# Patient Record
Sex: Male | Born: 1970 | Race: White | Hispanic: No | Marital: Married | Smoking: Former smoker
Health system: Southern US, Community
[De-identification: ages and names within clinical notes are randomized; demographics above are authoritative.]

## PROBLEM LIST (undated history)

## (undated) DIAGNOSIS — D72819 Decreased white blood cell count, unspecified: Secondary | ICD-10-CM

## (undated) DIAGNOSIS — I10 Essential (primary) hypertension: Secondary | ICD-10-CM

## (undated) DIAGNOSIS — J019 Acute sinusitis, unspecified: Secondary | ICD-10-CM

## (undated) DIAGNOSIS — M1990 Unspecified osteoarthritis, unspecified site: Secondary | ICD-10-CM

## (undated) DIAGNOSIS — Z8739 Personal history of other diseases of the musculoskeletal system and connective tissue: Secondary | ICD-10-CM

## (undated) DIAGNOSIS — E559 Vitamin D deficiency, unspecified: Secondary | ICD-10-CM

## (undated) DIAGNOSIS — I499 Cardiac arrhythmia, unspecified: Secondary | ICD-10-CM

## (undated) DIAGNOSIS — R809 Proteinuria, unspecified: Secondary | ICD-10-CM

## (undated) DIAGNOSIS — F39 Unspecified mood [affective] disorder: Secondary | ICD-10-CM

## (undated) DIAGNOSIS — E782 Mixed hyperlipidemia: Secondary | ICD-10-CM

## (undated) DIAGNOSIS — E870 Hyperosmolality and hypernatremia: Secondary | ICD-10-CM

## (undated) HISTORY — DX: Cardiac arrhythmia, unspecified: I49.9

## (undated) HISTORY — DX: Unspecified mood (affective) disorder: F39

## (undated) HISTORY — DX: Decreased white blood cell count, unspecified: D72.819

## (undated) HISTORY — DX: Hyperosmolality and hypernatremia: E87.0

## (undated) HISTORY — DX: Proteinuria, unspecified: R80.9

## (undated) HISTORY — DX: Acute sinusitis, unspecified: J01.90

## (undated) HISTORY — DX: Mixed hyperlipidemia: E78.2

## (undated) HISTORY — DX: Vitamin D deficiency, unspecified: E55.9

---

## 1999-09-23 ENCOUNTER — Encounter: Payer: Self-pay | Admitting: Family Medicine

## 1999-09-23 ENCOUNTER — Encounter: Admission: RE | Admit: 1999-09-23 | Discharge: 1999-09-23 | Payer: Self-pay | Admitting: Family Medicine

## 1999-10-10 ENCOUNTER — Encounter: Payer: Self-pay | Admitting: Surgery

## 1999-10-10 ENCOUNTER — Ambulatory Visit (HOSPITAL_COMMUNITY): Admission: RE | Admit: 1999-10-10 | Discharge: 1999-10-10 | Payer: Self-pay | Admitting: Surgery

## 1999-11-01 ENCOUNTER — Ambulatory Visit (HOSPITAL_BASED_OUTPATIENT_CLINIC_OR_DEPARTMENT_OTHER): Admission: RE | Admit: 1999-11-01 | Discharge: 1999-11-01 | Payer: Self-pay | Admitting: Surgery

## 2010-11-26 ENCOUNTER — Ambulatory Visit (INDEPENDENT_AMBULATORY_CARE_PROVIDER_SITE_OTHER): Payer: Self-pay | Admitting: Surgery

## 2016-02-12 ENCOUNTER — Other Ambulatory Visit: Payer: Self-pay | Admitting: Orthopedic Surgery

## 2016-02-12 DIAGNOSIS — G5621 Lesion of ulnar nerve, right upper limb: Secondary | ICD-10-CM

## 2016-02-19 ENCOUNTER — Ambulatory Visit
Admission: RE | Admit: 2016-02-19 | Discharge: 2016-02-19 | Disposition: A | Discharge status: Home / Self Care | Payer: Worker's Compensation | Source: Ambulatory Visit | Attending: Orthopedic Surgery | Admitting: Orthopedic Surgery

## 2016-02-19 DIAGNOSIS — G5621 Lesion of ulnar nerve, right upper limb: Secondary | ICD-10-CM

## 2016-09-15 ENCOUNTER — Other Ambulatory Visit (HOSPITAL_COMMUNITY): Payer: Self-pay | Admitting: Orthopedic Surgery

## 2016-09-15 DIAGNOSIS — I999 Unspecified disorder of circulatory system: Secondary | ICD-10-CM

## 2016-09-16 ENCOUNTER — Ambulatory Visit (HOSPITAL_COMMUNITY)
Admission: RE | Admit: 2016-09-16 | Discharge: 2016-09-16 | Disposition: A | Discharge status: Home / Self Care | Payer: Worker's Compensation | Source: Ambulatory Visit | Attending: Orthopedic Surgery | Admitting: Orthopedic Surgery

## 2016-09-16 DIAGNOSIS — I999 Unspecified disorder of circulatory system: Secondary | ICD-10-CM

## 2016-09-16 NOTE — Progress Notes (Addendum)
*  PRELIMINARY RESULTS* Vascular Ultrasound Right Upper Extremity Arterial Duplex has been completed.   Right upper extremity arteries are patent with triphasic flow. Allen's test: signal is unaffected with radial compression, and decreases >50% with ulnar compression. There is no obvious evidence of occlusive thrombus involving the right axillary, brachial, radial, or ulnar arteries.  09/16/2016 3:04 PM Gertie Fey, BS, RVT, RDCS, RDMS

## 2016-09-18 ENCOUNTER — Other Ambulatory Visit (HOSPITAL_COMMUNITY): Payer: Self-pay | Admitting: Orthopedic Surgery

## 2016-09-18 DIAGNOSIS — G5621 Lesion of ulnar nerve, right upper limb: Secondary | ICD-10-CM

## 2016-09-18 DIAGNOSIS — I8290 Acute embolism and thrombosis of unspecified vein: Secondary | ICD-10-CM

## 2016-09-19 ENCOUNTER — Other Ambulatory Visit: Payer: Self-pay | Admitting: Radiology

## 2016-09-22 ENCOUNTER — Ambulatory Visit (HOSPITAL_COMMUNITY): Payer: Worker's Compensation

## 2016-09-22 ENCOUNTER — Encounter (HOSPITAL_COMMUNITY): Payer: Self-pay

## 2016-09-22 ENCOUNTER — Ambulatory Visit (HOSPITAL_COMMUNITY)
Admission: RE | Admit: 2016-09-22 | Discharge: 2016-09-22 | Disposition: A | Discharge status: Home / Self Care | Payer: Worker's Compensation | Source: Ambulatory Visit | Attending: Orthopedic Surgery | Admitting: Orthopedic Surgery

## 2016-09-22 ENCOUNTER — Other Ambulatory Visit (HOSPITAL_COMMUNITY): Payer: Self-pay | Admitting: Orthopedic Surgery

## 2016-09-22 DIAGNOSIS — I8290 Acute embolism and thrombosis of unspecified vein: Secondary | ICD-10-CM

## 2016-09-22 DIAGNOSIS — Z87891 Personal history of nicotine dependence: Secondary | ICD-10-CM | POA: Insufficient documentation

## 2016-09-22 DIAGNOSIS — M79601 Pain in right arm: Secondary | ICD-10-CM | POA: Diagnosis not present

## 2016-09-22 DIAGNOSIS — R200 Anesthesia of skin: Secondary | ICD-10-CM | POA: Diagnosis present

## 2016-09-22 DIAGNOSIS — I10 Essential (primary) hypertension: Secondary | ICD-10-CM | POA: Diagnosis not present

## 2016-09-22 DIAGNOSIS — G43909 Migraine, unspecified, not intractable, without status migrainosus: Secondary | ICD-10-CM | POA: Diagnosis not present

## 2016-09-22 HISTORY — PX: IR ANGIOGRAM EXTREMITY RIGHT: IMG652

## 2016-09-22 HISTORY — PX: IR US GUIDE VASC ACCESS RIGHT: IMG2390

## 2016-09-22 HISTORY — DX: Essential (primary) hypertension: I10

## 2016-09-22 LAB — CBC
HEMATOCRIT: 44.8 % (ref 39.0–52.0)
Hemoglobin: 14.7 g/dL (ref 13.0–17.0)
MCH: 28.5 pg (ref 26.0–34.0)
MCHC: 32.8 g/dL (ref 30.0–36.0)
MCV: 86.8 fL (ref 78.0–100.0)
PLATELETS: 205 10*3/uL (ref 150–400)
RBC: 5.16 MIL/uL (ref 4.22–5.81)
RDW: 13.2 % (ref 11.5–15.5)
WBC: 4.4 10*3/uL (ref 4.0–10.5)

## 2016-09-22 LAB — BASIC METABOLIC PANEL
Anion gap: 6 (ref 5–15)
BUN: 18 mg/dL (ref 6–20)
CHLORIDE: 107 mmol/L (ref 101–111)
CO2: 26 mmol/L (ref 22–32)
CREATININE: 1.16 mg/dL (ref 0.61–1.24)
Calcium: 9.1 mg/dL (ref 8.9–10.3)
GFR calc Af Amer: >60 mL/min (ref >60)
GFR calc non Af Amer: >60 mL/min (ref >60)
GLUCOSE: 100 mg/dL — AB (ref 65–99)
Potassium: 3.9 mmol/L (ref 3.5–5.1)
SODIUM: 139 mmol/L (ref 135–145)

## 2016-09-22 LAB — PROTIME-INR
INR: 0.98
Prothrombin Time: 13 seconds (ref 11.4–15.2)

## 2016-09-22 LAB — APTT: aPTT: 32 seconds (ref 24–36)

## 2016-09-22 MED ORDER — MIDAZOLAM HCL 5 MG/5ML IJ SOLN
INTRAMUSCULAR | Status: AC | PRN
Start: 2016-09-22 — End: 2016-09-22

## 2016-09-22 MED ORDER — LIDOCAINE HCL 1 % IJ SOLN
INTRAMUSCULAR | Status: AC | PRN
  Administered 2016-09-22: 4 mL

## 2016-09-22 MED ORDER — FENTANYL CITRATE (PF) 100 MCG/2ML IJ SOLN
INTRAMUSCULAR | Status: AC | PRN
  Administered 2016-09-22: 25 ug via INTRAVENOUS
  Administered 2016-09-22: 50 ug via INTRAVENOUS
  Administered 2016-09-22: 25 ug via INTRAVENOUS

## 2016-09-22 MED ORDER — MIDAZOLAM HCL 2 MG/2ML IJ SOLN
INTRAMUSCULAR | Status: AC
  Filled 2016-09-22: qty 6

## 2016-09-22 MED ORDER — MIDAZOLAM HCL 2 MG/2ML IJ SOLN
INTRAMUSCULAR | Status: AC | PRN
Start: 2016-09-22 — End: 2016-09-22
  Administered 2016-09-22 (×6): 1 mg via INTRAVENOUS

## 2016-09-22 MED ORDER — LIDOCAINE HCL 1 % IJ SOLN
INTRAMUSCULAR | Status: AC
  Filled 2016-09-22: qty 20

## 2016-09-22 MED ORDER — SODIUM CHLORIDE 0.9 % IV SOLN
INTRAVENOUS | Status: DC
  Administered 2016-09-22: 09:00:00 via INTRAVENOUS

## 2016-09-22 MED ORDER — FENTANYL CITRATE (PF) 100 MCG/2ML IJ SOLN
INTRAMUSCULAR | Status: AC
  Filled 2016-09-22: qty 4

## 2016-09-22 MED ORDER — IODIXANOL 320 MG/ML IV SOLN
INTRAVENOUS | Status: AC | PRN
  Administered 2016-09-22: 100 mL via INTRA_ARTERIAL

## 2016-09-22 NOTE — Progress Notes (Signed)
Client work note provided and patient instructed that he may return to work with no restrictions at 12 pm on 09/23/16 per Dr. Deanne Coffer.

## 2016-09-22 NOTE — Sedation Documentation (Signed)
Gauze/tegaderm bandage applied to R fem art puncture. Groin level 0, 3+RDP, 1+RPT, drsg CDI.  

## 2016-09-22 NOTE — Discharge Instructions (Signed)
Excuse from Work, [employer], or Physical Activity _______________________[name]________________________________ needs to be excused from: _x___ Work ____ [employer] ____ Physical activity beginning 09/22/16 and through the following date: _____5/1/18 at 12 pm___________. He or she may return to work or school but should still avoid the following physical activity or activities from now until ________________. Activity restrictions include: ____ Lifting more than _______ lb ____ Sitting longer than __________ minutes at a time ____ Standing longer than ________ minutes at a time __x__ He or she may return to full physical activity as of ____5/1/18 at 12 pm____________. Health Care Provider Name (printed): _______Dr Deanne Coffer _________________________________ Health Care Provider (signature): ___________________________________________ Date: ________________ This information is not intended to replace advice given to you by your health care provider. Make sure you discuss any questions you have with your health care provider. Document Released: 11/05/2000 Document Revised: 04/25/2016 Document Reviewed: 12/12/2013 Elsevier Interactive Patient Education  2017 Elsevier Inc.                                                                                                  Femoral Site Care Refer to this sheet in the next few weeks. These instructions provide you with information about caring for yourself after your procedure. Your health care provider may also give you more specific instructions. Your treatment has been planned according to current medical practices, but problems sometimes occur. Call your health care provider if you have any problems or questions after your procedure. What can I expect after the procedure? After your procedure, it is typical to have the following:  Bruising at the site  that usually fades within 1-2 weeks.  Blood collecting in the tissue (hematoma) that may be painful to the touch. It should usually decrease in size and tenderness within 1-2 weeks. Follow these instructions at home:  Take medicines only as directed by your health care provider.  You may shower 24-48 hours after the procedure or as directed by your health care provider. Remove the bandage (dressing) and gently wash the site with plain soap and water. Pat the area dry with a clean towel. Do not rub the site, because this may cause bleeding.  Do not take baths, swim, or use a hot tub until your health care provider approves.  Check your insertion site every day for redness, swelling, or drainage.  Do not apply powder or lotion to the site.  Limit use of stairs to twice a day for the first 2-3 days or as directed by your health care provider.  Do not squat for the first 2-3 days or as directed by your health care provider.  Do not lift over 10 lb (4.5 kg) for 5 days after your procedure or as directed by your health care provider.  Ask your health care provider when it is okay to:  Return to work or school.  Resume usual physical activities or sports.  Resume sexual activity.  Do not drive home if you are discharged the same day as the procedure. Have someone else drive you.  You may drive 24 hours after the procedure unless otherwise  instructed by your health care provider.  Do not operate machinery or power tools for 24 hours after the procedure or as directed by your health care provider.  If your procedure was done as an outpatient procedure, which means that you went home the same day as your procedure, a responsible adult should be with you for the first 24 hours after you arrive home.  Keep all follow-up visits as directed by your health care provider. This is important. Contact a health care provider if:  You have a fever.  You have chills.  You have increased  bleeding from the site. Hold pressure on the site. Get help right away if:  You have unusual pain at the site.  You have redness, warmth, or swelling at the site.  You have drainage (other than a small amount of blood on the dressing) from the site.  The site is bleeding, and the bleeding does not stop after 30 minutes of holding steady pressure on the site.  Your leg or foot becomes pale, cool, tingly, or numb. This information is not intended to replace advice given to you by your health care provider. Make sure you discuss any questions you have with your health care provider. Document Released: 01/13/2014 Document Revised: 10/18/2015 Document Reviewed: 11/29/2013 Elsevier Interactive Patient Education  2017 Elsevier Inc. Moderate Conscious Sedation, Adult, Care After These instructions provide you with information about caring for yourself after your procedure. Your health care provider may also give you more specific instructions. Your treatment has been planned according to current medical practices, but problems sometimes occur. Call your health care provider if you have any problems or questions after your procedure. What can I expect after the procedure? After your procedure, it is common:  To feel sleepy for several hours.  To feel clumsy and have poor balance for several hours.  To have poor judgment for several hours.  To vomit if you eat too soon. Follow these instructions at home: For at least 24 hours after the procedure:    Do not:  Participate in activities where you could fall or become injured.  Drive.  Use heavy machinery.  Drink alcohol.  Take sleeping pills or medicines that cause drowsiness.  Make important decisions or sign legal documents.  Take care of children on your own.  Rest. Eating and drinking   Follow the diet recommended by your health care provider.  If you vomit:  Drink water, juice, or soup when you can drink without  vomiting.  Make sure you have little or no nausea before eating solid foods. General instructions   Have a responsible adult stay with you until you are awake and alert.  Take over-the-counter and prescription medicines only as told by your health care provider.  If you smoke, do not smoke without supervision.  Keep all follow-up visits as told by your health care provider. This is important. Contact a health care provider if:  You keep feeling nauseous or you keep vomiting.  You feel light-headed.  You develop a rash.  You have a fever. Get help right away if:  You have trouble breathing. This information is not intended to replace advice given to you by your health care provider. Make sure you discuss any questions you have with your health care provider. Document Released: 03/02/2013 Document Revised: 10/15/2015 Document Reviewed: 09/01/2015 Elsevier Interactive Patient Education  2017 [employer].

## 2016-09-22 NOTE — H&P (Signed)
Chief Complaint: Patient was seen in consultation today for right upper extremity arteriogram with possible angioplasty/stent placement at the request of [facility]  Referring Physician(s): Bradly Bienenstock  Supervising Physician: Oley Balm  Patient Status: [facility] - Out-pt  History of Present Illness: Derek Gallagher is a 46 y.o. male   Pt is a Multimedia programmer by trade. He uses right hand exclusively for working (he writes left handed) He had noticed discoloration of Rt finger tips as early as Jan 2017 Tips of fingers 2/3/4 mostly. Discoloration would come and go; with or without working. Even working with arms down---not always above head. Was seen by Annabell Howells MD at work Jan 2018: no issues found. Doppler studies and nerve conduction studies all normal. Has continued to work as Psychologist, occupational Now symptoms are to point of discoloration is minimal to only occasionally; but pain and numbness occurs --almost every evening. He does not sleep with arms over head. Pan and numbness travel from all fingertips to elbow. Resolves with rest.  Doppler 09/16/16: Summary: Right upper extremity arteries are patent with triphasic flow. Allen&'s test: signal is unaffected with radial compression, and decreases >50% with ulnar compression.  Quit all tobacco products 3 yrs ago.  Has been sent for evaluation per Dr Melvyn Novas    Past Medical History:  Diagnosis Date  . Hypertension     History reviewed. No pertinent surgical history.  Allergies: Patient has no known allergies.  Medications: Prior to Admission medications   Medication Sig Start Date End Date Taking? Authorizing Provider  albuterol (PROVENTIL HFA;VENTOLIN HFA) 108 (90 Base) MCG/ACT inhaler Inhale 1-2 puffs into the lungs every 6 (six) hours as needed for wheezing or shortness of breath. 02/26/15  Yes Historical Provider, MD  azelastine (ASTELIN) 0.1 % nasal spray Place 1 spray into both nostrils at bedtime. 06/16/16  06/16/17 Yes Historical Provider, MD  celecoxib (CELEBREX) 200 MG capsule Take 200 mg by mouth daily. 06/16/16  Yes Historical Provider, MD  Cholecalciferol (VITAMIN D) 2000 units tablet Take 2,000 Units by mouth daily.   Yes Historical Provider, MD  Cyanocobalamin (VITAMIN B-12) 5000 MCG SUBL Place 5,000 mcg under the tongue daily.   Yes Historical Provider, MD  GLUCOSAMINE-CHONDROITIN PO Take 2 tablets by mouth daily. 1200-1500 mg   Yes Historical Provider, MD  HYDROcodone-acetaminophen (NORCO) 7.5-325 MG tablet Take 1 tablet by mouth every 6 (six) hours as needed for pain. 06/16/16  Yes Historical Provider, MD  Boris Lown Oil 500 MG CAPS Take 1,000 mg by mouth daily.   Yes Historical Provider, MD  losartan (COZAAR) 25 MG tablet Take 25 mg by mouth at bedtime. 06/30/16  Yes Historical Provider, MD  Multiple Vitamin (MULTI-VITAMINS) TABS Take 1 tablet by mouth daily. Centrum Silver   Yes Historical Provider, MD  tiZANidine (ZANAFLEX) 4 MG tablet Take 8 mg by mouth at bedtime. 06/16/16  Yes Historical Provider, MD  vitamin A 8000 UNIT capsule Take 8,000 Units by mouth daily.   Yes Historical Provider, MD  Aspirin-Salicylamide-Caffeine (BC FAST PAIN RELIEF) 650-195-33.3 MG PACK Take 1 Package by mouth daily as needed (for migraine headaches.).    Historical Provider, MD  montelukast (SINGULAIR) 10 MG tablet Take 10 mg by mouth at bedtime as needed. For seasonal sinusitis. 06/16/16   Historical Provider, MD     History reviewed. No pertinent family history.  Social History   Social History  . Marital status: Married    Spouse name: N/A  . Number of children: N/A  . Years of  education: N/A   Social History Main Topics  . Smoking status: Former Smoker    Types: Cigarettes    Quit date: 09/22/2013  . Smokeless tobacco: None  . Alcohol use None  . Drug use: Unknown  . Sexual activity: Not Asked   Other Topics Concern  . None   Social History Narrative  . None    Review of Systems: A 12 point  ROS discussed and pertinent positives are indicated in the HPI above.  All other systems are negative.  Review of Systems  Constitutional: Negative for activity change, appetite change, fatigue and fever.  Respiratory: Negative for shortness of breath.   Gastrointestinal: Negative for abdominal pain and nausea.  Musculoskeletal: Negative for back pain.  Neurological: Negative for weakness.  Psychiatric/Behavioral: Negative for behavioral problems and confusion.    Vital Signs: BP 122/87   Pulse 71   Temp 97.8 F (36.6 C)   Resp 20   Ht  (1.778 m)   Wt 195 lb (88.5 kg)   SpO2 100%   BMI 27.98 kg/m   Physical Exam  Constitutional: He is oriented to person, place, and time. He appears well-nourished.  Cardiovascular: Normal rate, regular rhythm and normal heart sounds.   Pulmonary/Chest: Effort normal and breath sounds normal.  Abdominal: Soft. Bowel sounds are normal. There is no tenderness.  Musculoskeletal: Normal range of motion. He exhibits no edema, tenderness or deformity.  Neurological: He is alert and oriented to person, place, and time.  Skin: Skin is warm and dry. No erythema. No pallor.  Psychiatric: He has a normal mood and affect. His behavior is normal. Judgment and thought content normal.  Nursing note and vitals reviewed.   Mallampati Score:  MD Evaluation Airway: WNL Heart: WNL Abdomen: WNL Chest/ Lungs: WNL ASA  Classification: 2 Mallampati/Airway Score: One  Imaging: No results found.  Labs:  CBC:  Recent Labs  09/22/16 0837  WBC 4.4  HGB 14.7  HCT 44.8  PLT 205    COAGS:  Recent Labs  09/22/16 0837  INR 0.98  APTT 32    BMP:  Recent Labs  09/22/16 0837  NA 139  K 3.9  CL 107  CO2 26  GLUCOSE 100*  BUN 18  CALCIUM 9.1  CREATININE 1.16  GFRNONAA >60  GFRAA >60    LIVER FUNCTION TESTS: No results for input(s): BILITOT, AST, ALT, ALKPHOS, PROT, ALBUMIN in the last 8760 hours.  TUMOR MARKERS: No results for  input(s): AFPTM, CEA, CA199, CHROMGRNA in the last 8760 hours.  Assessment and Plan:  Right upper extremity pain; numbness; finger discoloration periodically since Jan 2017 Worsening symptoms over few months Doppler studies neg. Previous work up all wnl Now scheduled for RUE arteriogram with possible angioplasty/stent placement Risks and Benefits discussed with the patient including, but not limited to bleeding, infection, vascular injury or contrast induced renal failure. All of the patient's questions were answered, patient is agreeable to proceed. Consent signed and in chart.  Thank you for this interesting consult.  I greatly enjoyed meeting LORING LISKEY and look forward to participating in their care.  A copy of this report was sent to the requesting provider on this date.  Electronically Signed: Josehua Hammar A 09/22/2016, 9:42 AM   I spent a total of  30 Minutes   in face to face in clinical consultation, greater than 50% of which was counseling/coordinating care for RUE arteriogram

## 2016-09-22 NOTE — Procedures (Signed)
RUE arteriogram negative No complication No blood loss. See complete dictation in [facility].

## 2016-09-22 NOTE — Sedation Documentation (Signed)
Gauze/tegaderm bandage applied to R fem art puncture. Groin level 0, 3+RDP, 1+RPT, drsg CDI.

## 2016-09-22 NOTE — Sedation Documentation (Signed)
5 Fr sheath removed from R groin by K Hines, RTR. Hemostasis achieved using Exoseal closure device. Groin level 0, 3+RDP, 1+RPT.

## 2017-12-09 IMAGING — XA IR US GUIDE VASC ACCESS RIGHT
1 series · 14 of 24 positions shown · IV contrast (IODINE)
Comparison: none

INDICATION: Recurrent discoloration of right third through fifth fingers.
Recurrent numbness. No previous surgery. No history of peripheral
vascular disease.

[Series 300: dsa extremities · 14 of 95 slices shown]
[im 1/95]
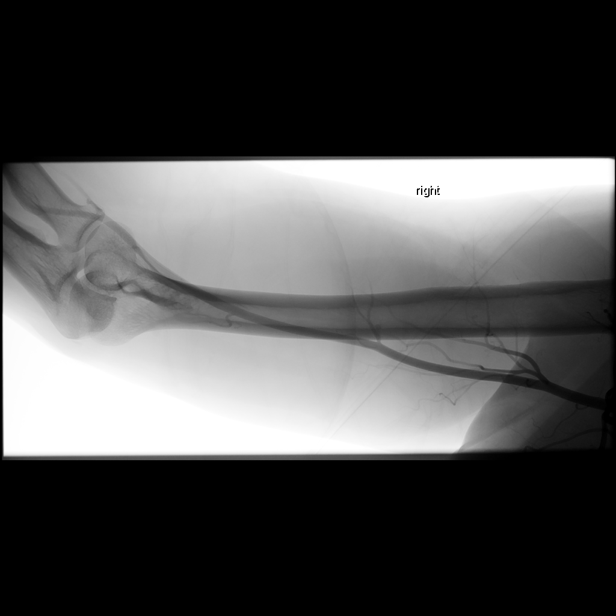
[im 9/95]
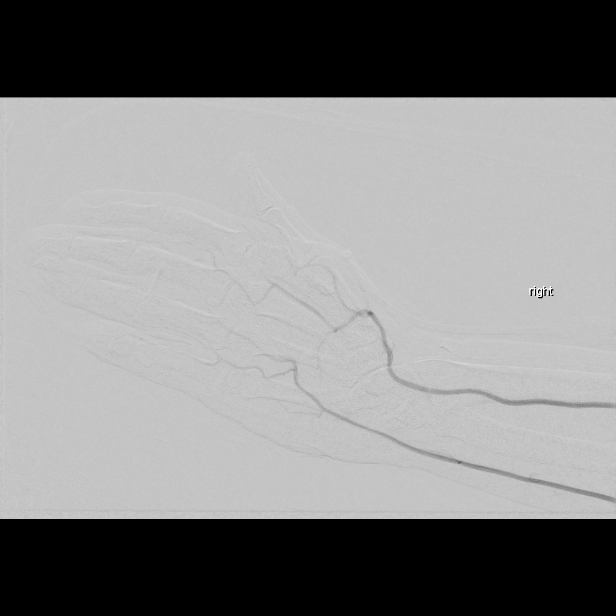
[im 17/95]
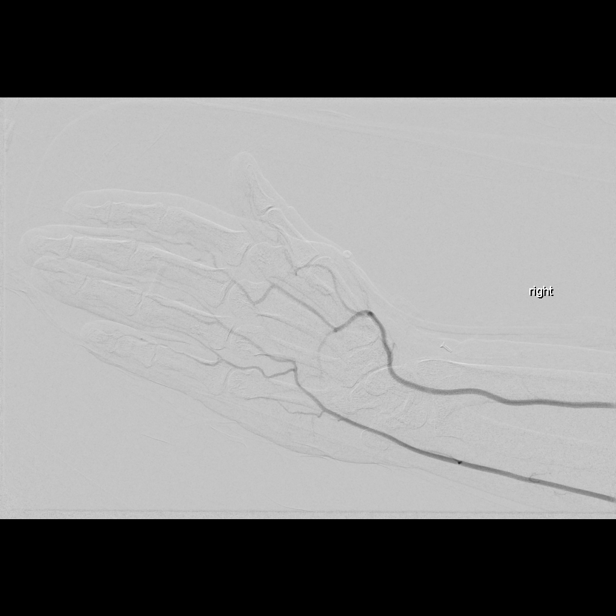
[im 25/95]
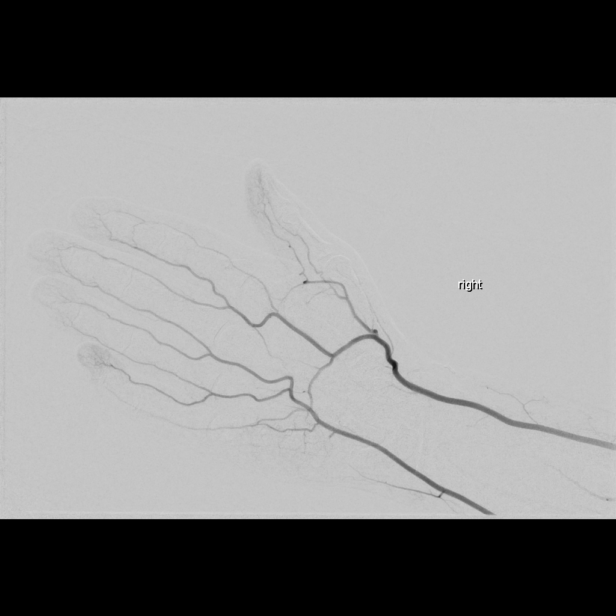
[im 29/95]
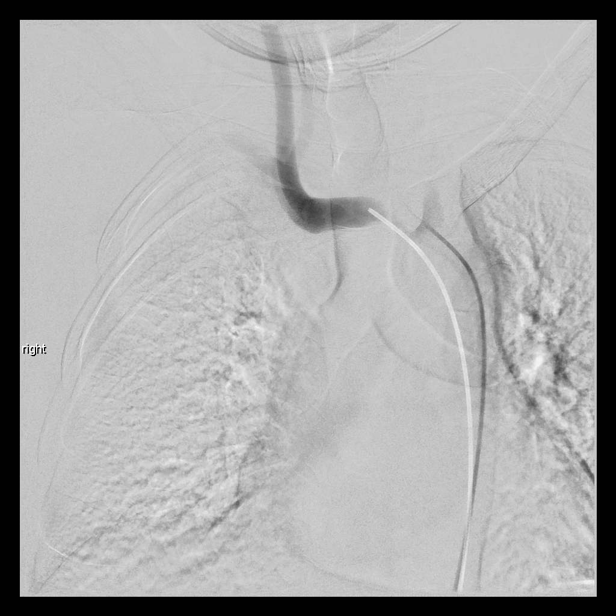
[im 37/95]
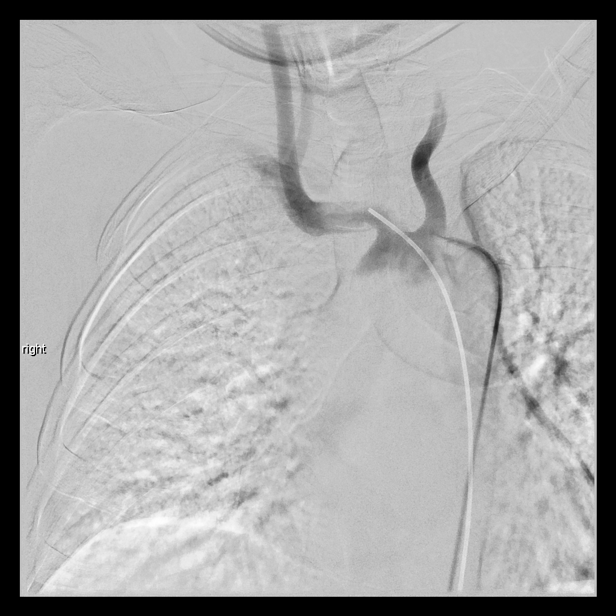
[im 45/95]
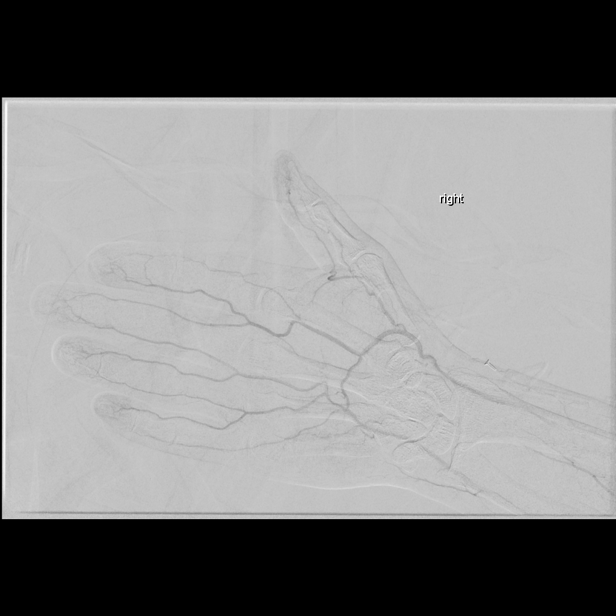
[im 50/95]
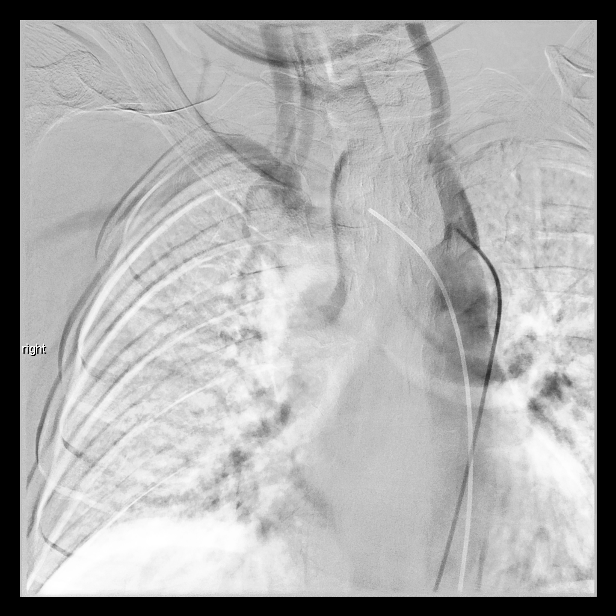
[im 58/95]
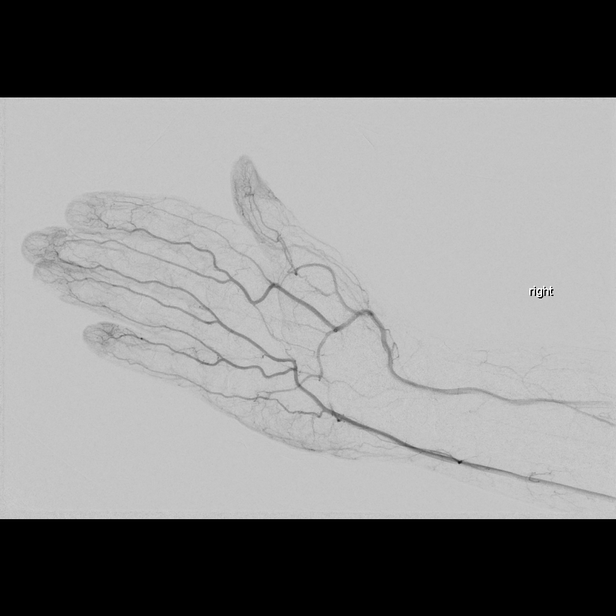
[im 66/95]
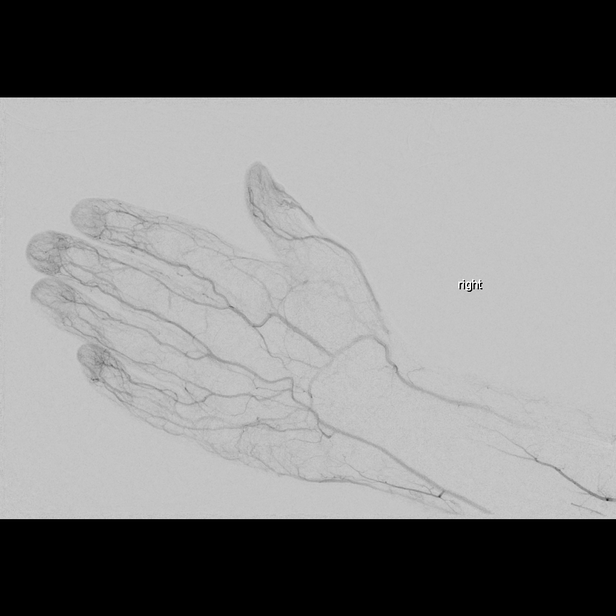
[im 74/95]
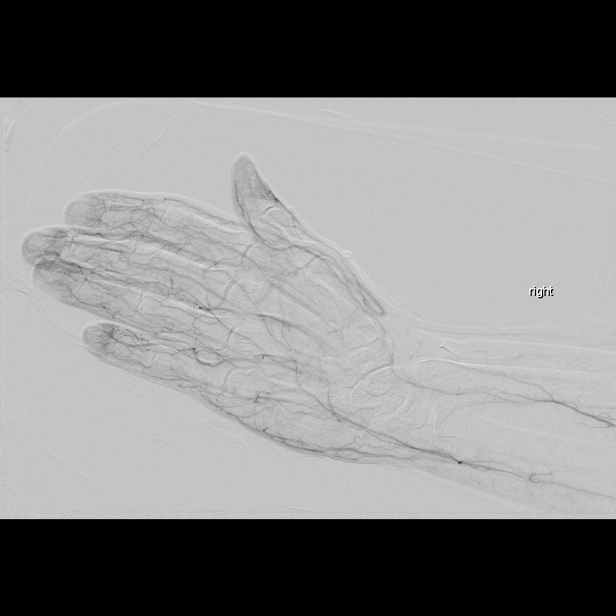
[im 78/95]
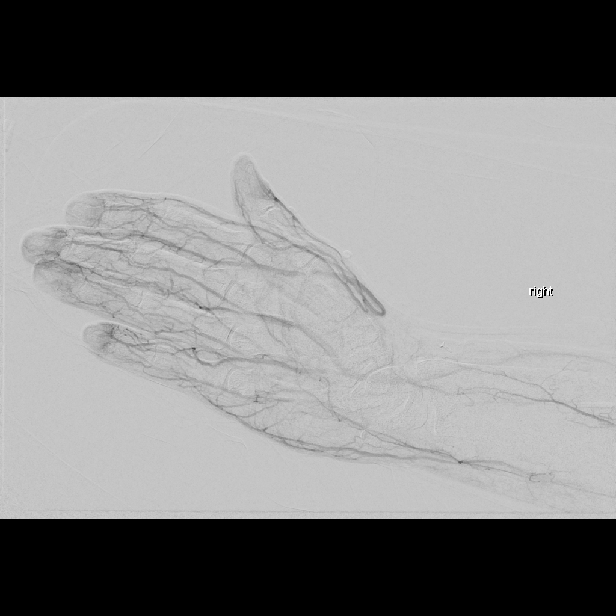
[im 86/95]
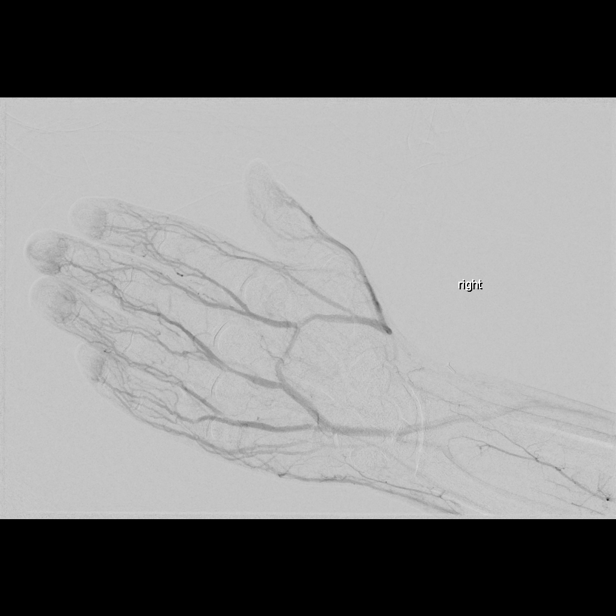
[im 95/95]
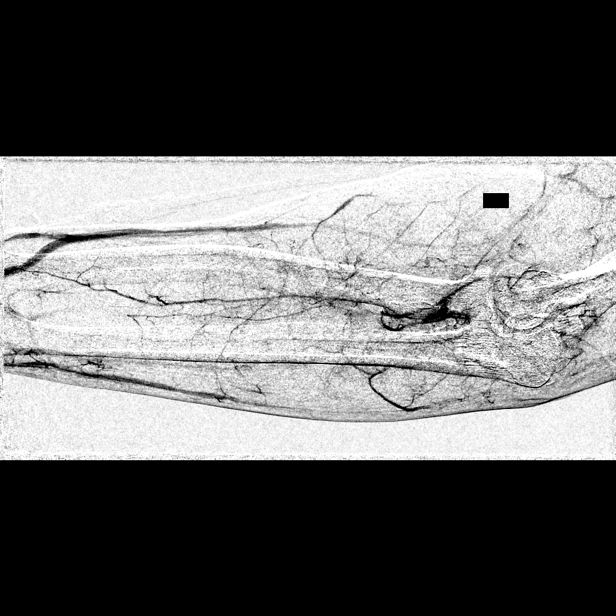

[14 of 24 positions shown; findings below may reference images not displayed]

EXAM:
RIGHT UPPER EXTREMITY ARTERIOGRAM

ULTRASOUND ACCESS FROM GUIDANCE FOR VASCULAR ACCESS

MEDICATIONS:
None indicated.

ANESTHESIA/SEDATION:
Intravenous Fentanyl and Versed were administered as conscious
sedation during continuous monitoring of the patient's level of
consciousness and physiological / cardiorespiratory status by the
radiology RN, with a total moderate sedation time of 28 minutes.

CONTRAST:  Visipaque IA



Patency of the right common femoral artery confirmed with
ultrasound. Under real-time ultrasound guidance, the vessel was
accessed with a 21-gauge micropuncture needle, exchanged over a 018
guidewire for a transitional dilator, through which a 035 guidewire
was advanced. A 5 French vascular sheath was placed, through which a
5 French long Kum[place] the catheter was advanced in used to catheterize
the right brachiocephalic artery for arteriography. The catheter was
advanced into the right subclavian artery for additional right upper
extremity arteriography. The catheter was removed. After
confirmatory femoral injection, the sheath removed and hemostasis
achieved with the aid of the Exoseal device. The patient tolerated
the procedure well.

FLUOROSCOPY TIME:  4.6 minutes, 917 uZymO DAP

COMPLICATIONS:
None immediate.
FINDINGS: Classic 3 vessel aortic brachiocephalic arterial origin anatomy
without proximal stenosis. Normal right brachiocephalic artery,
subclavian artery, axillary and brachial arteries. Radial, ulnar,
and interosseous arteries are widely patent.

Radial artery crosses the wrist as the primary supply to the deep
palmar arch which is complete. The

Ulnar artery crosses the wrist and supplies the superficial palmar
arch which is incomplete, perfusing the medial aspect of the long
finger, ring and little fingers. No focal occlusion, aneurysm, web,
beading, or other arterial pathology.
IMPRESSION: Negative right upper extremity arteriogram.

## 2018-02-18 ENCOUNTER — Encounter: Payer: Self-pay | Admitting: *Deleted

## 2018-02-18 ENCOUNTER — Ambulatory Visit: Payer: BC Managed Care – PPO | Admitting: Cardiovascular Disease

## 2018-02-18 ENCOUNTER — Encounter: Payer: Self-pay | Admitting: Cardiovascular Disease

## 2018-02-18 VITALS — BP 120/84 | HR 72 | Ht 68.5 in | Wt 193.0 lb

## 2018-02-18 DIAGNOSIS — I10 Essential (primary) hypertension: Secondary | ICD-10-CM

## 2018-02-18 DIAGNOSIS — R079 Chest pain, unspecified: Secondary | ICD-10-CM

## 2018-02-18 DIAGNOSIS — R002 Palpitations: Secondary | ICD-10-CM

## 2018-02-18 NOTE — Patient Instructions (Signed)
Medication Instructions:  Your physician recommends that you continue on your current medications as directed. Please refer to the Current Medication list given to you today.   Labwork: NONE   Testing/Procedures: Your physician has recommended that you wear an event monitor. Event monitors are medical devices that record the heart's electrical activity. Doctors most often [place] these monitors to diagnose arrhythmias. Arrhythmias are problems with the speed or rhythm of the heartbeat. The monitor is a small, portable device. You can wear one while you do your normal daily activities. This is usually used to diagnose what is causing palpitations/syncope (passing out).    Follow-Up: Your physician wants you to follow-up in: 3 months. You will receive a reminder letter in the mail two months in advance. If you don't receive a letter, please call our office to schedule the follow-up appointment.   Any Other Special Instructions Will Be Listed Below (If Applicable).     If you need a refill on your cardiac medications before your next appointment, please call your pharmacy.  Thank you for choosing [facility]!

## 2018-02-18 NOTE — Progress Notes (Signed)
CARDIOLOGY CONSULT NOTE  Patient ID: Derek Gallagher MRN: [id number] DOB/AGE: 07-22-1970 47 y.o.  Admit date: (Not on file) Primary Physician: Shellia Cleverly, PA Referring Physician: Shellia Cleverly, PA  Reason for Consultation: Chest pain and palpitations  HPI: Derek Gallagher is a 47 y.o. male who is being seen today for the evaluation of chest pain and palpitations at the request of Shellia Cleverly, [place].   Past medical history includes neck pain, hypertension, and lumbar degenerative disc disease.  He has a prior history of tobacco use.  I reviewed the electronic medical record.  It appears he was evaluated in 02/11/2018 by his PCP for chest pain and palpitations.  I do not have a copy of the ECG performed at the PCPs office.  He has been experiencing palpitations for the last few months which primarily occurred at night.  Last week after taking a break at work he suddenly experienced forceful palpitations accompanied by chest soreness.  He continues to feel a mild soreness of his chest due to that experience.  He denies exertional chest pain and exertional dyspnea.  He did have associated nausea and lightheadedness last week when he experienced palpitations.  He has chronic cervical spine pain and lower back pain.  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  He has been doing this job for the past 13 years and works anywhere between 50 to 60 hours/week.  He ruptured a lumbar disc a few years ago when trying to lift 300 pounds at work.  He takes Zanaflex at night and a Norco tablet every morning for chronic pain in order to be able to function.  He also showed me a picture of when his fingers turned blue.  He reportedly underwent an unremarkable work-up for this.   No Known Allergies  Current Outpatient Medications  Medication Sig Dispense Refill  . albuterol (PROVENTIL HFA;VENTOLIN HFA) 108 (90 Base) MCG/ACT inhaler Inhale 1-2 puffs into the  lungs every 6 (six) hours as needed for wheezing or shortness of breath.    Marland Kitchen azelastine (ASTELIN) 0.1 % nasal spray Place 1 spray into both nostrils at bedtime.    . Cyanocobalamin (VITAMIN B-12) 5000 MCG SUBL Place 5,000 mcg under the tongue daily.    Marland Kitchen etodolac (LODINE) 400 MG tablet Take 400 mg by mouth 2 (two) times daily.    Marland Kitchen glucosamine-chondroitin 500-400 MG tablet Take 1 tablet by mouth daily.    Marland Kitchen HYDROcodone-acetaminophen (NORCO) 7.5-325 MG tablet Take 1 tablet by mouth 2 (two) times daily as needed.     Marland Kitchen losartan (COZAAR) 25 MG tablet Take 25 mg by mouth at bedtime.    . Multiple Vitamin (MULTI-VITAMINS) TABS Take 1 tablet by mouth daily. Centrum Silver    . omega-3 acid ethyl esters (LOVAZA) 1 g capsule Take 2 g by mouth 2 (two) times daily.    Marland Kitchen tiZANidine (ZANAFLEX) 4 MG tablet Take 4 mg by mouth every 8 (eight) hours as needed.     . Vitamin D, Ergocalciferol, (DRISDOL) 50000 units CAPS capsule Take 50,000 Units by mouth every 7 (seven) days.     No current facility-administered medications for this visit.     Past Medical History:  Diagnosis Date  . Hypertension     Past Surgical History:  Procedure Laterality Date  . IR ANGIOGRAM EXTREMITY RIGHT  09/22/2016  . IR US GUIDE VASC ACCESS RIGHT  09/22/2016  Social History   Socioeconomic History  . Marital status: Married    Spouse name: Not on file  . Number of children: Not on file  . Years of education: Not on file  . Highest education level: Not on file  Occupational History  . Not on file  Social Needs  . Financial resource strain: Not on file  . Food insecurity:    Worry: Not on file    Inability: Not on file  . Transportation needs:    Medical: Not on file    Non-medical: Not on file  Tobacco Use  . Smoking status: Former Smoker    Types: Cigarettes    Last attempt to quit: 09/22/2013    Years since quitting: 4.4  . Smokeless tobacco: Former Neurosurgeon    Types: Chew    Quit date: 02/18/2014    Substance and Sexual Activity  . Alcohol use: Not on file  . Drug use: Not on file  . Sexual activity: Not on file  Lifestyle  . Physical activity:    Days per week: Not on file    Minutes per session: Not on file  . Stress: Not on file  Relationships  . Social connections:    Talks on phone: Not on file    Gets together: Not on file    Attends religious service: Not on file    Active member of club or organization: Not on file    Attends meetings of clubs or organizations: Not on file    Relationship status: Not on file  . Intimate partner violence:    Fear of current or ex partner: Not on file    Emotionally abused: Not on file    Physically abused: Not on file    Forced sexual activity: Not on file  Other Topics Concern  . Not on file  Social History Narrative  . Not on file     No family history of premature CAD in 1st degree relatives.  Current Meds  Medication Sig  . albuterol (PROVENTIL HFA;VENTOLIN HFA) 108 (90 Base) MCG/ACT inhaler Inhale 1-2 puffs into the lungs every 6 (six) hours as needed for wheezing or shortness of breath.  Marland Kitchen azelastine (ASTELIN) 0.1 % nasal spray Place 1 spray into both nostrils at bedtime.  . Cyanocobalamin (VITAMIN B-12) 5000 MCG SUBL Place 5,000 mcg under the tongue daily.  Marland Kitchen etodolac (LODINE) 400 MG tablet Take 400 mg by mouth 2 (two) times daily.  Marland Kitchen glucosamine-chondroitin 500-400 MG tablet Take 1 tablet by mouth daily.  Marland Kitchen HYDROcodone-acetaminophen (NORCO) 7.5-325 MG tablet Take 1 tablet by mouth 2 (two) times daily as needed.   Marland Kitchen losartan (COZAAR) 25 MG tablet Take 25 mg by mouth at bedtime.  . Multiple Vitamin (MULTI-VITAMINS) TABS Take 1 tablet by mouth daily. Centrum Silver  . omega-3 acid ethyl esters (LOVAZA) 1 g capsule Take 2 g by mouth 2 (two) times daily.  Marland Kitchen tiZANidine (ZANAFLEX) 4 MG tablet Take 4 mg by mouth every 8 (eight) hours as needed.   . Vitamin D, Ergocalciferol, (DRISDOL) 50000 units CAPS capsule Take 50,000 Units  by mouth every 7 (seven) days.      Review of systems complete and found to be negative unless listed above in HPI    Physical exam Blood pressure 118/80, pulse 71, height 5' 8.5" (1.74 m), weight 193 lb (87.5 kg), SpO2 97 %. General: NAD Neck: No JVD, no thyromegaly or thyroid nodule.  Lungs: Clear to auscultation bilaterally with normal respiratory  effort. CV: Nondisplaced PMI. Regular rate and rhythm, normal S1/S2, no S3/S4, no murmur.  No peripheral edema.  No carotid bruit.    Abdomen: Soft, nontender, no distention.  Skin: Intact without lesions or rashes.  Neurologic: Alert and oriented x 3.  Psych: Normal affect. Extremities: No clubbing or cyanosis.  HEENT: Normal.   ECG: Most recent ECG reviewed.   Labs: Lab Results  Component Value Date/Time   K 3.9 09/22/2016 08:37 AM   BUN 18 09/22/2016 08:37 AM   CREATININE 1.16 09/22/2016 08:37 AM   HGB 14.7 09/22/2016 08:37 AM     Lipids: No results found for: LDLCALC, LDLDIRECT, CHOL, TRIG, HDL      ASSESSMENT AND PLAN:   1.  Chest pain: This appeared to have been caused by forceful palpitations.  He continues to have chest soreness.  I will obtain an exercise Myoview to see if there is any evidence of ischemic heart disease.  2.  Palpitations: There reportedly suddenly start rather than a gradual onset.  Unclear if he is experiencing an atrial tachyarrhythmia.  I will obtain a 30-day event monitor. I will order a 2-D echocardiogram with Doppler to evaluate cardiac structure, function, and regional wall motion.  3.  Hypertension: Controlled on losartan 25 mg.  No changes.    Disposition: Follow up in 3 months  Signed: Prentice Docker, M.D., F.A.C.C.  02/18/2018, 3:48 PM

## 2018-02-20 ENCOUNTER — Ambulatory Visit (INDEPENDENT_AMBULATORY_CARE_PROVIDER_SITE_OTHER): Payer: BC Managed Care – PPO

## 2018-02-20 DIAGNOSIS — R002 Palpitations: Secondary | ICD-10-CM

## 2018-02-25 ENCOUNTER — Encounter (HOSPITAL_COMMUNITY): Payer: BC Managed Care – PPO

## 2018-02-25 ENCOUNTER — Other Ambulatory Visit (HOSPITAL_COMMUNITY): Payer: BC Managed Care – PPO

## 2018-05-14 ENCOUNTER — Ambulatory Visit: Payer: BC Managed Care – PPO | Admitting: Cardiovascular Disease

## 2019-12-10 ENCOUNTER — Encounter: Payer: Self-pay | Admitting: Emergency Medicine

## 2019-12-10 ENCOUNTER — Ambulatory Visit
Admission: EM | Admit: 2019-12-10 | Discharge: 2019-12-10 | Disposition: A | Discharge status: Home / Self Care | Payer: BC Managed Care – PPO | Attending: Emergency Medicine | Admitting: Emergency Medicine

## 2019-12-10 ENCOUNTER — Ambulatory Visit (INDEPENDENT_AMBULATORY_CARE_PROVIDER_SITE_OTHER): Payer: BC Managed Care – PPO

## 2019-12-10 ENCOUNTER — Other Ambulatory Visit: Payer: Self-pay

## 2019-12-10 DIAGNOSIS — R05 Cough: Secondary | ICD-10-CM

## 2019-12-10 DIAGNOSIS — R0602 Shortness of breath: Secondary | ICD-10-CM | POA: Diagnosis not present

## 2019-12-10 DIAGNOSIS — J208 Acute bronchitis due to other specified organisms: Secondary | ICD-10-CM

## 2019-12-10 HISTORY — DX: Unspecified osteoarthritis, unspecified site: M19.90

## 2019-12-10 HISTORY — DX: Personal history of other diseases of the musculoskeletal system and connective tissue: Z87.39

## 2019-12-10 MED ORDER — BENZONATATE 100 MG PO CAPS: 100.0000 mg | ORAL_CAPSULE | Freq: Three times a day (TID) | ORAL | 0 refills | Status: AC

## 2019-12-10 MED ORDER — AZITHROMYCIN 250 MG PO TABS: 250.0000 mg | ORAL_TABLET | Freq: Every day | ORAL | 0 refills | Status: AC

## 2019-12-10 MED ORDER — PREDNISONE 10 MG PO TABS: 20.0000 mg | ORAL_TABLET | Freq: Every day | ORAL | 0 refills | Status: AC

## 2019-12-10 MED ORDER — ALBUTEROL SULFATE HFA 108 (90 BASE) MCG/ACT IN AERS: 1.0000 | INHALATION_SPRAY | Freq: Four times a day (QID) | RESPIRATORY_TRACT | 0 refills | Status: DC | PRN

## 2019-12-10 NOTE — Discharge Instructions (Addendum)
Get plenty of rest and push fluids Tessalon Perles prescribed for cough Azithromycin was prescribed for possible acute bronchitis ProAir was prescribed for shortness of breath Low-dose prednisone was prescribed Use medications daily for symptom relief Use OTC medications like ibuprofen or tylenol as needed fever or pain Call or go to the ED if you have any new or worsening symptoms such as fever, worsening cough, shortness of breath, chest tightness, chest pain, turning blue, changes in mental status, etc..Marland Kitchen

## 2019-12-10 NOTE — ED Triage Notes (Signed)
Pt dx with a sinus infection recently, has been on abx. Now pt has dry cough and feels like he has a lung infection. States he has a bad taste in his mouth when he coughs.

## 2019-12-10 NOTE — ED Provider Notes (Signed)
[facility]   [id number] 12/10/19 Arrival Time: 1235   Chief Complaint  Patient presents with   Cough     SUBJECTIVE: History from: patient.  Derek Gallagher is a 49 y.o. male who presented to the urgent care with a complaint of cough, shortness of breath for the past few days.  Reports ongoing sinus infection that was treated with antibiotic last month.  Denies sick exposure to COVID, flu or strep.  Denies recent travel.  Has never tried any OTC medication.  Denies aggravating or alleviating factors.  Report previous symptoms in the past.   Denies fever, chills, fatigue, rhinorrhea, sore throat, wheezing, chest pain, nausea, changes in bowel or bladder habits.     ROS: As per HPI.  All other pertinent ROS negative.      Past Medical History:  Diagnosis Date   Arthritis    H/O degenerative disc disease    Hypertension    Past Surgical History:  Procedure Laterality Date   IR ANGIOGRAM EXTREMITY RIGHT  09/22/2016   IR US GUIDE VASC ACCESS RIGHT  09/22/2016   No Known Allergies No current facility-administered medications on file prior to encounter.   Current Outpatient Medications on File Prior to Encounter  Medication Sig Dispense Refill   azelastine (ASTELIN) 0.1 % nasal spray Place 1 spray into both nostrils at bedtime.     Cyanocobalamin (VITAMIN B-12) 5000 MCG SUBL Place 5,000 mcg under the tongue daily.     etodolac (LODINE) 400 MG tablet Take 400 mg by mouth 2 (two) times daily.     glucosamine-chondroitin 500-400 MG tablet Take 1 tablet by mouth daily.     losartan (COZAAR) 25 MG tablet Take 25 mg by mouth at bedtime.     Multiple Vitamin (MULTI-VITAMINS) TABS Take 1 tablet by mouth daily. Centrum Silver     omega-3 acid ethyl esters (LOVAZA) 1 g capsule Take 2 g by mouth 2 (two) times daily.     tiZANidine (ZANAFLEX) 4 MG tablet Take 4 mg by mouth every 8 (eight) hours as needed.      Vitamin D, Ergocalciferol, (DRISDOL) 50000 units CAPS  capsule Take 50,000 Units by mouth every 7 (seven) days.     HYDROcodone-acetaminophen (NORCO) 7.5-325 MG tablet Take 1 tablet by mouth 2 (two) times daily as needed.      Social History   Socioeconomic History   Marital status: Married    Spouse name: Not on file   Number of children: Not on file   Years of education: Not on file   Highest education level: Not on file  Occupational History   Not on file  Tobacco Use   Smoking status: Former Smoker    Types: Cigarettes    Quit date: 09/22/2013    Years since quitting: 6.2   Smokeless tobacco: Former Neurosurgeon    Types: Chew    Quit date: 02/18/2014  Substance and Sexual Activity   Alcohol use: Yes    Comment: occ   Drug use: Not on file   Sexual activity: Not on file  Other Topics Concern   Not on file  Social History Narrative   Not on file   Social Determinants of Health   Financial Resource Strain:    Difficulty of Paying Living Expenses:   Food Insecurity:    Worried About Programme researcher, broadcasting/film/video in the Last Year:    Ran Out of Food in the Last Year:   Transportation Needs:    Lack  of Transportation (Medical):    Lack of Transportation (Non-Medical):   Physical Activity:    Days of Exercise per Week:    Minutes of Exercise per Session:   Stress:    Feeling of Stress :   Social Connections:    Frequency of Communication with Friends and Family:    Frequency of Social Gatherings with Friends and Family:    Attends Religious Services:    Active Member of Clubs or Organizations:    Attends Engineer, structural:    Marital Status:   Intimate Partner Violence:    Fear of Current or Ex-Partner:    Emotionally Abused:    Physically Abused:    Sexually Abused:    Family History  Problem Relation Age of Onset   Lung cancer Maternal Grandfather     OBJECTIVE:  Vitals:   12/10/19 1245 12/10/19 1248  BP:  (!) 147/83  Pulse:  76  Resp:  17  Temp:  98.7 F (37.1 C)  TempSrc:   Oral  SpO2:  98%  Weight: 190 lb (86.2 kg)   Height: 5\' 9"  (1.753 m)      Physical Exam Vitals and nursing note reviewed.  Constitutional:      General: He is not in acute distress.    Appearance: Normal appearance. He is normal weight. He is not ill-appearing, toxic-appearing or diaphoretic.  HENT:     Head: Normocephalic.     Right Ear: Tympanic membrane, ear canal and external ear normal. There is no impacted cerumen.     Left Ear: Tympanic membrane, ear canal and external ear normal. There is no impacted cerumen.     Nose: Nose normal. No congestion.     Mouth/Throat:     Mouth: Mucous membranes are moist.     Pharynx: Oropharynx is clear. No oropharyngeal exudate.  Cardiovascular:     Rate and Rhythm: Normal rate and regular rhythm.     Pulses: Normal pulses.     Heart sounds: Normal heart sounds. No murmur heard.  No friction rub. No gallop.   Pulmonary:     Effort: Pulmonary effort is normal. No respiratory distress.     Breath sounds: Normal breath sounds. No stridor. No wheezing, rhonchi or rales.  Chest:     Chest wall: No tenderness.  Neurological:     Mental Status: He is alert and oriented to person, place, and time.     LABS:  No results found for this or any previous visit (from the past 24 hour(s)).   ASSESSMENT & PLAN:  1. Acute bronchitis due to other specified organisms   2. Shortness of breath     Meds ordered this encounter  Medications   benzonatate (TESSALON) 100 MG capsule    Sig: Take 1 capsule (100 mg total) by mouth every 8 (eight) hours.    Dispense:  21 capsule    Refill:  0   azithromycin (ZITHROMAX) 250 MG tablet    Sig: Take 1 tablet (250 mg total) by mouth daily. Take first 2 tablets together, then 1 every day until finished.    Dispense:  6 tablet    Refill:  0   predniSONE (DELTASONE) 10 MG tablet    Sig: Take 2 tablets (20 mg total) by mouth daily.    Dispense:  15 tablet    Refill:  0   albuterol (VENTOLIN HFA) 108 (90  Base) MCG/ACT inhaler    Sig: Inhale 1-2 puffs into the lungs every 6 (  six) hours as needed for wheezing or shortness of breath.    Dispense:  18 g    Refill:  0   Discharge Instructions  Get plenty of rest and push fluids Tessalon Perles prescribed for cough Azithromycin was prescribed for possible acute bronchitis ProAir was prescribed for shortness of breath Low-dose prednisone was prescribed Use medications daily for symptom relief Use OTC medications like ibuprofen or tylenol as needed fever or pain Call or go to the ED if you have any new or worsening symptoms such as fever, worsening cough, shortness of breath, chest tightness, chest pain, turning blue, changes in mental status, etc...   Reviewed expectations re: course of current medical issues. Questions answered. Outlined signs and symptoms indicating need for more acute intervention. Patient verbalized understanding. After Visit Summary given.     Note: This document was prepared using Dragon voice recognition software and may include unintentional dictation errors.     Durward Parcel, FNP 12/10/19 1422

## 2020-07-08 ENCOUNTER — Encounter: Payer: Self-pay | Admitting: Emergency Medicine

## 2020-07-08 ENCOUNTER — Ambulatory Visit
Admission: EM | Admit: 2020-07-08 | Discharge: 2020-07-08 | Disposition: A | Discharge status: Home / Self Care | Payer: BC Managed Care – PPO

## 2020-07-08 ENCOUNTER — Other Ambulatory Visit: Payer: Self-pay

## 2020-07-08 DIAGNOSIS — M62838 Other muscle spasm: Secondary | ICD-10-CM | POA: Diagnosis not present

## 2020-07-08 DIAGNOSIS — M549 Dorsalgia, unspecified: Secondary | ICD-10-CM

## 2020-07-08 DIAGNOSIS — S81812A Laceration without foreign body, left lower leg, initial encounter: Secondary | ICD-10-CM | POA: Diagnosis not present
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
## 2020-07-08 NOTE — Discharge Instructions (Addendum)
Keep laceration clean with warm water and mild soap Apply thin layer of Neosporin daily Rest, ice and heat as needed Ensure adequate range of motion as tolerated. Injuries all appear to be muscular in nature at this time Continue to take ibuprofen as needed for inflammation and pain relief.  DO NOT TAKE WITH OTHER antiinflammatories, as this may cause GI upset and/or bleed Continue tiazidine as needed at for muscle spasm.   Expect some increased pain in the next 1-3 days.  It may take 3-4 weeks for complete resolution of symptoms Will f/u with his doctor or here if not seeing significant improvement within one week. Return here or go to ER if you have any new or worsening symptoms such as numbness/tingling of the inner thighs, loss of bladder or bowel control, headache/blurry vision, nausea/vomiting, confusion/altered mental status, dizziness, weakness, passing out, imbalance, etc..Marland Kitchen

## 2021-02-25 IMAGING — DX DG CHEST 2V
2 series · 2 of 2 positions shown · non-contrast
Comparison: None.

CLINICAL DATA: Shortness of breath and cough.

EXAM:
CHEST - 2 VIEW

[chest pa]
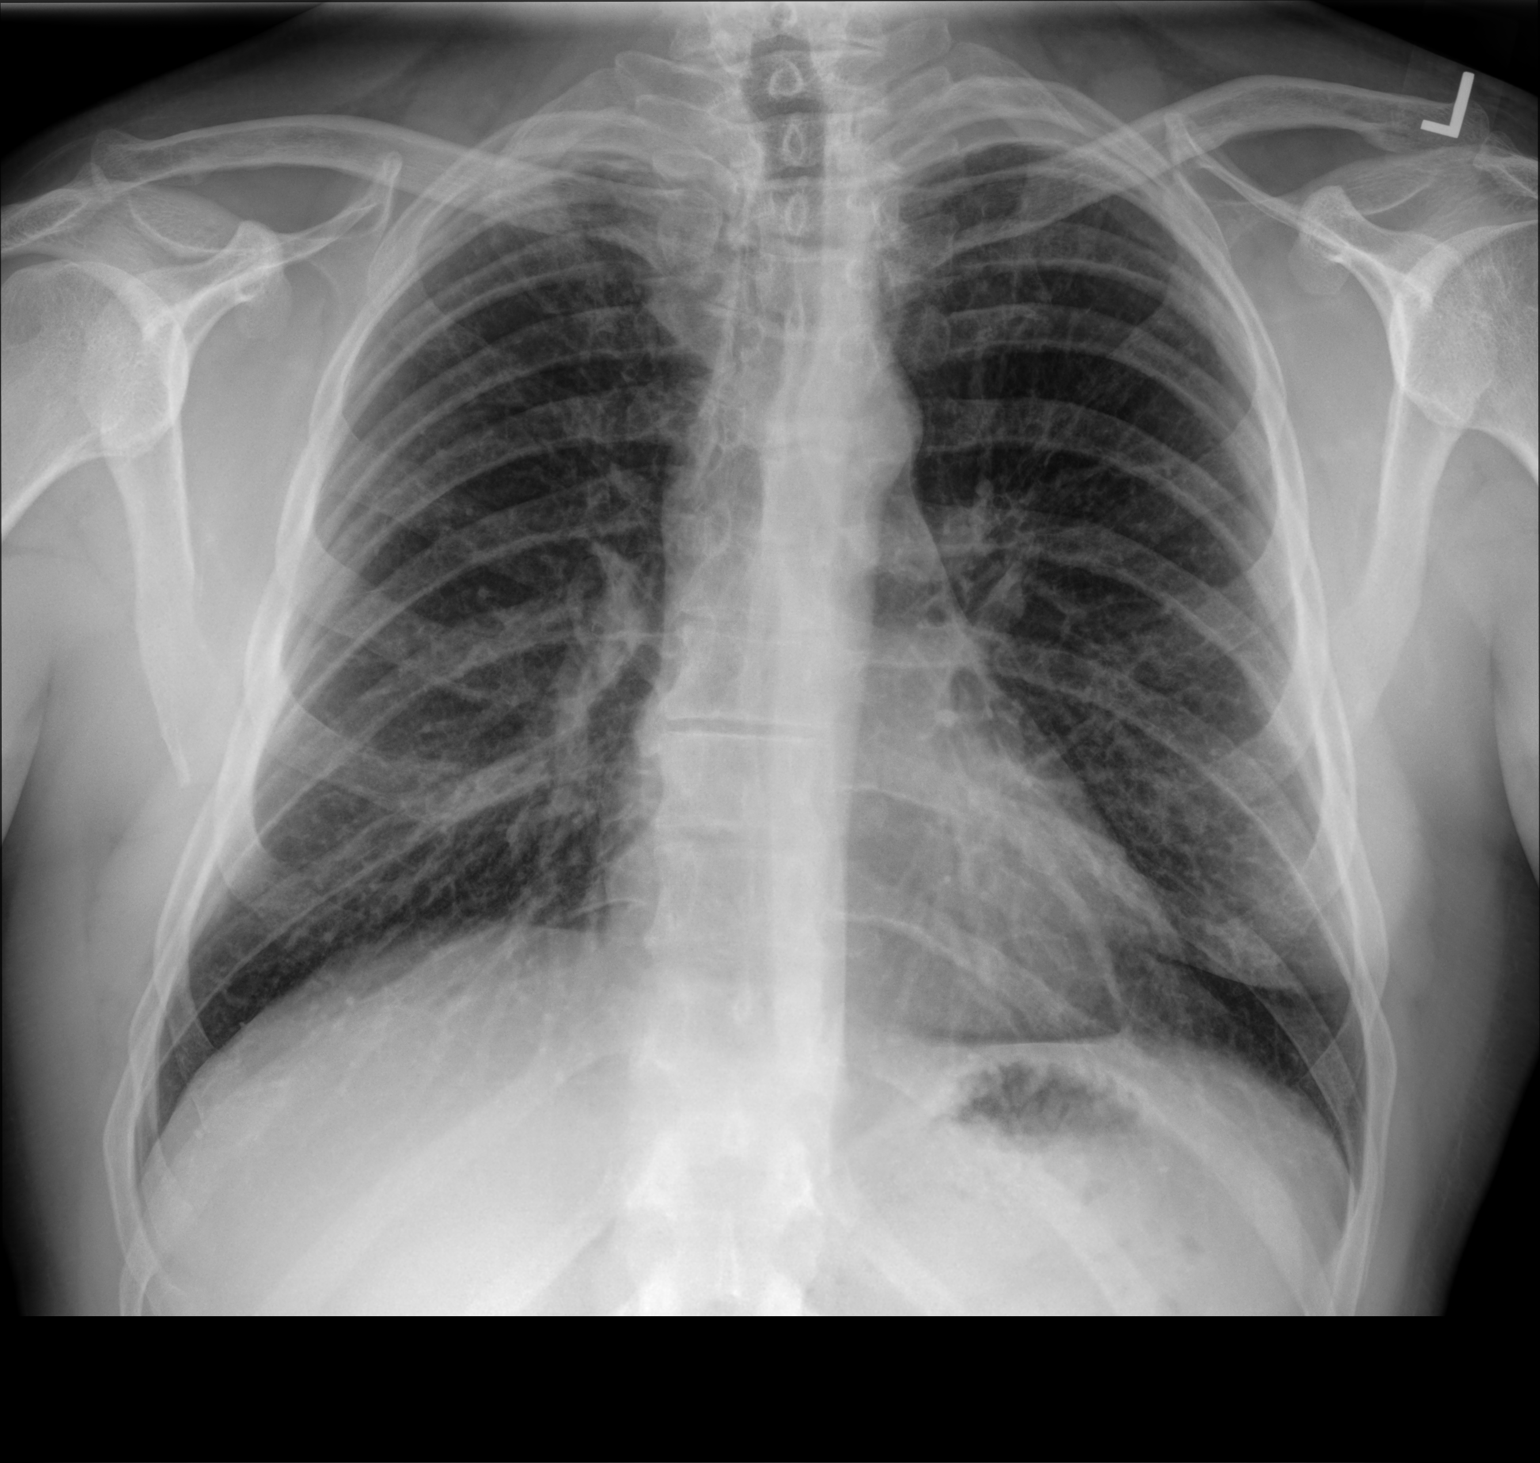

[chest lat]
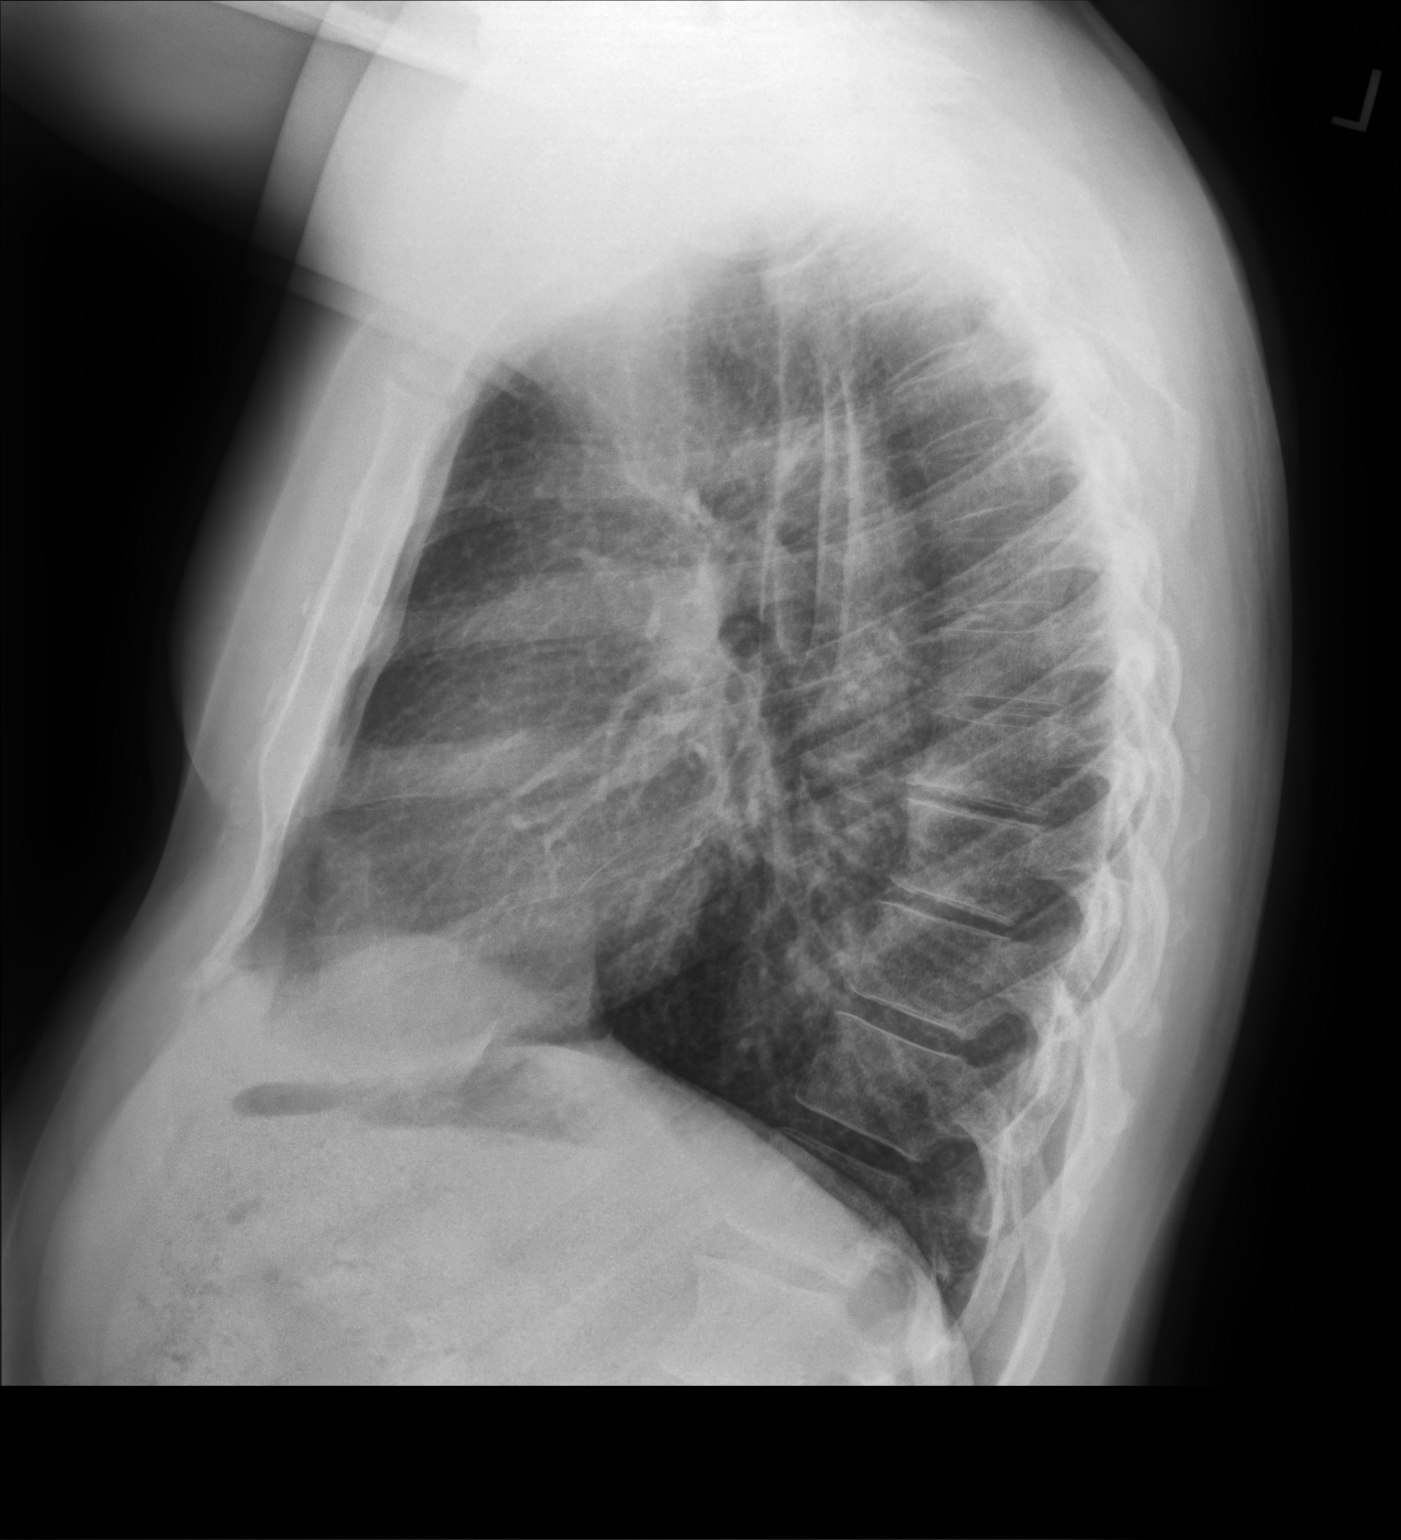

[2 of 2 positions shown; findings below may reference images not displayed]

FINDINGS: Slightly prominent lung markings without frank pulmonary edema or
airspace disease. Heart and mediastinum are within normal limits.
Trachea is midline. No large pleural effusions. Bone structures are
unremarkable.
IMPRESSION: No active cardiopulmonary disease.

## 2021-03-03 ENCOUNTER — Encounter: Payer: Self-pay | Admitting: Emergency Medicine

## 2021-03-03 ENCOUNTER — Other Ambulatory Visit: Payer: Self-pay

## 2021-03-03 ENCOUNTER — Ambulatory Visit
Admission: EM | Admit: 2021-03-03 | Discharge: 2021-03-03 | Disposition: A | Discharge status: Home / Self Care | Payer: BC Managed Care – PPO | Attending: Family Medicine | Admitting: Family Medicine

## 2021-03-03 DIAGNOSIS — J3089 Other allergic rhinitis: Secondary | ICD-10-CM | POA: Diagnosis not present

## 2021-03-03 DIAGNOSIS — J309 Allergic rhinitis, unspecified: Secondary | ICD-10-CM | POA: Diagnosis not present

## 2021-03-03 DIAGNOSIS — R051 Acute cough: Secondary | ICD-10-CM | POA: Diagnosis not present

## 2021-03-03 DIAGNOSIS — H6982 Other specified disorders of Eustachian tube, left ear: Secondary | ICD-10-CM

## 2021-03-03 MED ORDER — PREDNISONE 20 MG PO TABS: 40.0000 mg | ORAL_TABLET | Freq: Every day | ORAL | 0 refills | Status: AC

## 2021-03-03 MED ORDER — ALBUTEROL SULFATE HFA 108 (90 BASE) MCG/ACT IN AERS: 1.0000 | INHALATION_SPRAY | Freq: Four times a day (QID) | RESPIRATORY_TRACT | 0 refills | Status: AC | PRN

## 2021-03-03 MED ORDER — MONTELUKAST SODIUM 10 MG PO TABS: 10.0000 mg | ORAL_TABLET | Freq: Every day | ORAL | 2 refills | Status: AC

## 2021-03-03 MED ORDER — LEVOCETIRIZINE DIHYDROCHLORIDE 5 MG PO TABS: 5.0000 mg | ORAL_TABLET | Freq: Every evening | ORAL | 2 refills | Status: AC

## 2021-03-03 NOTE — ED Triage Notes (Signed)
Has URI, started Monday, seen doctor Wednesday and was started on steroids and z-pack and now it hasn't helped.

## 2021-03-03 NOTE — ED Provider Notes (Signed)
[facility]    CSN: [id number] Arrival date & time: 03/03/21  1044      History   Chief Complaint Chief Complaint  Patient presents with   URI    HPI Derek Gallagher is a 50 y.o. male.   Patient presenting today with 6-day history of sinus pressure and congestion, productive cough, headache, scratchy throat.  Denies fever, chills, chest pain, shortness of breath, abdominal pain, nausea vomiting or diarrhea.  Had a tele-doc visit 2 days after onset and was prescribed prednisone and azithromycin which he just finished.  He states that this helped somewhat but his symptoms are returning since completion.  Does have a known history of significant seasonal allergies not currently on any medications for this.  He states he is dealt with this issue and recurrent sinus infections several times a year for 20 years now and that they no longer make the medication that used to help him and the over-the-counter medications do not help.   Past Medical History:  Diagnosis Date   Arthritis    H/O degenerative disc disease    Hypertension     There are no problems to display for this patient.   Past Surgical History:  Procedure Laterality Date   IR ANGIOGRAM EXTREMITY RIGHT  09/22/2016   IR US GUIDE VASC ACCESS RIGHT  09/22/2016     Home Medications    Prior to Admission medications   Medication Sig Start Date End Date Taking? Authorizing Provider  levocetirizine (XYZAL) 5 MG tablet Take 1 tablet (5 mg total) by mouth every evening. 03/03/21  Yes Particia Nearing, PA-C  montelukast (SINGULAIR) 10 MG tablet Take 1 tablet (10 mg total) by mouth at bedtime. 03/03/21  Yes Particia Nearing, PA-C  predniSONE (DELTASONE) 20 MG tablet Take 2 tablets (40 mg total) by mouth daily with breakfast. 03/03/21  Yes Particia Nearing, PA-C  albuterol (VENTOLIN HFA) 108 (90 Base) MCG/ACT inhaler Inhale 1-2 puffs into the lungs every 6 (six) hours as needed for wheezing or  shortness of breath. 03/03/21   Particia Nearing, PA-C  azelastine (ASTELIN) 0.1 % nasal spray Place 1 spray into both nostrils at bedtime. 06/16/16 12/10/19  [provider]  azithromycin (ZITHROMAX) 250 MG tablet Take 1 tablet (250 mg total) by mouth daily. Take first 2 tablets together, then 1 every day until finished. 12/10/19   Avegno, Zachery Dakins, FNP  benzonatate (TESSALON) 100 MG capsule Take 1 capsule (100 mg total) by mouth every 8 (eight) hours. 12/10/19   Avegno, Zachery Dakins, FNP  Cyanocobalamin (VITAMIN B-12) 5000 MCG SUBL Place 5,000 mcg under the tongue daily.    [provider]  etodolac (LODINE) 400 MG tablet Take 400 mg by mouth 2 (two) times daily.    [provider]  glucosamine-chondroitin 500-400 MG tablet Take 1 tablet by mouth daily.    [provider]  HYDROcodone-acetaminophen (NORCO) 7.5-325 MG tablet Take 1 tablet by mouth 2 (two) times daily as needed.  06/16/16   [provider]  losartan (COZAAR) 25 MG tablet Take 25 mg by mouth at bedtime. 06/30/16   [provider]  Multiple Vitamin (MULTI-VITAMINS) TABS Take 1 tablet by mouth daily. Centrum Silver    [provider]  omega-3 acid ethyl esters (LOVAZA) 1 g capsule Take 2 g by mouth 2 (two) times daily.    [provider]  predniSONE (DELTASONE) 10 MG tablet Take 2 tablets (20 mg total) by mouth daily. 12/10/19  Avegno, Zachery Dakins, FNP  tiZANidine (ZANAFLEX) 4 MG tablet Take 4 mg by mouth every 8 (eight) hours as needed.  06/16/16   [provider]  Vitamin D, Ergocalciferol, (DRISDOL) 50000 units CAPS capsule Take 50,000 Units by mouth every 7 (seven) days.    [provider]    Family History Family History  Problem Relation Age of Onset   Lung cancer Maternal Grandfather    Social History Social History   Tobacco Use   Smoking status: Former    Types: Cigarettes    Quit date: 09/22/2013    Years since quitting: 7.4    Smokeless tobacco: Former    Types: Chew    Quit date: 02/18/2014  Substance Use Topics   Alcohol use: Yes    Comment: occ     Allergies   Patient has no known allergies.   Review of Systems Review of Systems Per HPI  Physical Exam Triage Vital Signs ED Triage Vitals  Enc Vitals Group     BP 03/03/21 1302 (!) 155/90     Pulse Rate 03/03/21 1302 81     Resp 03/03/21 1302 17     Temp 03/03/21 1302 98 F (36.7 C)     Temp Source 03/03/21 1302 Oral     SpO2 03/03/21 1302 97 %     Weight 03/03/21 1303 190 lb (86.2 kg)     Height --      Head Circumference --      Peak Flow --      Pain Score 03/03/21 1303 0     Pain Loc --      Pain Edu? --      Excl. in GC? --    No data found.  Updated Vital Signs BP (!) 155/90 (BP Location: Right Arm)   Pulse 81   Temp 98 F (36.7 C) (Oral)   Resp 17   Wt 190 lb (86.2 kg)   SpO2 97%   BMI 28.06 kg/m   Visual Acuity Right Eye Distance:   Left Eye Distance:   Bilateral Distance:    Right Eye Near:   Left Eye Near:    Bilateral Near:     Physical Exam Vitals and nursing note reviewed.  Constitutional:      Appearance: Normal appearance.  HENT:     Head: Atraumatic.     Ears:     Comments: Bilateral middle ear effusions    Nose: Rhinorrhea present.     Mouth/Throat:     Pharynx: Posterior oropharyngeal erythema present.  Eyes:     Extraocular Movements: Extraocular movements intact.     Conjunctiva/sclera: Conjunctivae normal.  Cardiovascular:     Rate and Rhythm: Normal rate and regular rhythm.  Pulmonary:     Effort: Pulmonary effort is normal.     Breath sounds: Normal breath sounds. No wheezing or rales.  Musculoskeletal:        General: Normal range of motion.     Cervical back: Normal range of motion and neck supple.  Skin:    General: Skin is warm and dry.  Neurological:     General: No focal deficit present.     Mental Status: He is oriented to person, place, and time.  Psychiatric:        Mood  and Affect: Mood normal.        Thought Content: Thought content normal.        Judgment: Judgment normal.     UC Treatments /  Results  Labs (all labs ordered are listed, but only abnormal results are displayed) Labs Reviewed - No data to display  EKG   Radiology No results found.  Procedures Procedures (including critical care time)  Medications Ordered in UC Medications - No data to display  Initial Impression / Assessment and Plan / UC Course  I have reviewed the triage vital signs and the nursing notes.  Pertinent labs & imaging results that were available during my care of the patient were reviewed by me and considered in my medical decision making (see chart for details).     Consistent with allergic sinusitis, no evidence today of a bacterial component suspect inflammatory in nature.  Will restart prednisone and start a good allergy regimen with both Xyzal and Singulair given the extent of his known allergic rhinitis.  Did discuss trying different types of nasal sprays over-the-counter to see if any of these were tolerated and using Mucinex, sinus rinses regularly while having exacerbations of symptoms.  Follow-up with allergist if still not tolerating these medications.  Albuterol also refilled as he states he wheezes and has chest tightness from time to time.  He is a former smoker.  Final Clinical Impressions(s) / UC Diagnoses   Final diagnoses:  Allergic sinusitis  Seasonal allergic rhinitis due to other allergic trigger  Acute dysfunction of left eustachian tube  Acute cough   Discharge Instructions   None    ED Prescriptions     Medication Sig Dispense Auth. Provider   predniSONE (DELTASONE) 20 MG tablet Take 2 tablets (40 mg total) by mouth daily with breakfast. 10 tablet Particia Nearing, PA-C   levocetirizine (XYZAL) 5 MG tablet Take 1 tablet (5 mg total) by mouth every evening. 30 tablet Particia Nearing, PA-C   montelukast (SINGULAIR) 10  MG tablet Take 1 tablet (10 mg total) by mouth at bedtime. 30 tablet Particia Nearing, [place]   albuterol (VENTOLIN HFA) 108 (90 Base) MCG/ACT inhaler Inhale 1-2 puffs into the lungs every 6 (six) hours as needed for wheezing or shortness of breath. 18 g Particia Nearing, [place]      PDMP not reviewed this encounter.   Particia Nearing, [place] 03/03/21 1351

## 2021-04-08 ENCOUNTER — Other Ambulatory Visit: Payer: Self-pay | Admitting: Family Medicine

## 2021-04-08 NOTE — Telephone Encounter (Signed)
Requested Prescriptions  Pending Prescriptions Disp Refills  . montelukast (SINGULAIR) 10 MG tablet [Pharmacy Med Name: MONTELUKAST 10MG  TABLETS] 90 tablet     Sig: TAKE 1 TABLET(10 MG) BY MOUTH AT BEDTIME     Pulmonology:  Leukotriene Inhibitors Failed - 04/08/2021  2:17 PM      Failed - Valid encounter within last 12 months    Recent Outpatient Visits   None

## 2022-10-31 ENCOUNTER — Other Ambulatory Visit (HOSPITAL_COMMUNITY): Payer: Self-pay | Admitting: Internal Medicine

## 2022-10-31 DIAGNOSIS — E782 Mixed hyperlipidemia: Secondary | ICD-10-CM

## 2022-12-26 ENCOUNTER — Ambulatory Visit (HOSPITAL_COMMUNITY)
Admission: RE | Admit: 2022-12-26 | Discharge: 2022-12-26 | Disposition: A | Discharge status: Home / Self Care | Payer: BC Managed Care – PPO | Source: Ambulatory Visit | Attending: Internal Medicine | Admitting: Internal Medicine

## 2022-12-26 DIAGNOSIS — E782 Mixed hyperlipidemia: Secondary | ICD-10-CM | POA: Insufficient documentation

## 2023-09-29 ENCOUNTER — Telehealth: Payer: Self-pay | Admitting: *Deleted

## 2023-09-29 ENCOUNTER — Ambulatory Visit: Payer: Self-pay | Attending: Internal Medicine

## 2023-09-29 DIAGNOSIS — R002 Palpitations: Secondary | ICD-10-CM

## 2023-09-29 NOTE — Telephone Encounter (Signed)
 Pt notified of monitor and order placed.

## 2023-09-29 NOTE — Telephone Encounter (Signed)
-----   Message from Vishnu P Mallipeddi sent at 09/28/2023  8:53 AM EDT ----- Regarding: RE: long term monitor Yes please. ----- Message ----- From: Aubry Blase, LPN Sent: 08/30/9627  11:10 AM EDT To: Vishnu P Mallipeddi, MD Subject: long term monitor                              Good morning,  This pt is a new referral scheduled to see you on 01/01/24 for palpitations and irregular heart rate from Dr. Quentin Brunner office. The referral request a long term monitor be placed. Would you like for us  to order this now or wait until the pt is seen?   Thank you,  Guillermina Leer, LPN

## 2023-12-03 DIAGNOSIS — R002 Palpitations: Secondary | ICD-10-CM

## 2023-12-09 ENCOUNTER — Ambulatory Visit: Payer: Self-pay | Admitting: Internal Medicine

## 2023-12-21 NOTE — Progress Notes (Signed)
Updated medical hx 

## 2024-01-01 ENCOUNTER — Ambulatory Visit: Payer: Self-pay | Admitting: Internal Medicine
